# Patient Record
Sex: Female | Born: 1965 | Race: White | Hispanic: No | Marital: Married
Health system: Southern US, Community
[De-identification: ages and names within clinical notes are randomized; demographics above are authoritative.]

## PROBLEM LIST (undated history)

## (undated) HISTORY — PX: AUGMENTATION MAMMAPLASTY: SUR837

---

## 2014-08-09 ENCOUNTER — Other Ambulatory Visit (HOSPITAL_COMMUNITY): Payer: Self-pay | Admitting: Obstetrics and Gynecology

## 2014-08-09 DIAGNOSIS — Z1231 Encounter for screening mammogram for malignant neoplasm of breast: Secondary | ICD-10-CM

## 2014-08-26 ENCOUNTER — Ambulatory Visit (HOSPITAL_COMMUNITY)
Admission: RE | Admit: 2014-08-26 | Discharge: 2014-08-26 | Disposition: A | Discharge status: Home / Self Care | Payer: 59 | Source: Ambulatory Visit | Attending: Obstetrics and Gynecology | Admitting: Obstetrics and Gynecology

## 2014-08-26 ENCOUNTER — Other Ambulatory Visit (HOSPITAL_COMMUNITY): Payer: Self-pay | Admitting: Obstetrics and Gynecology

## 2014-08-26 DIAGNOSIS — Z1231 Encounter for screening mammogram for malignant neoplasm of breast: Secondary | ICD-10-CM

## 2015-08-02 ENCOUNTER — Other Ambulatory Visit: Payer: Self-pay

## 2015-08-02 DIAGNOSIS — Z1231 Encounter for screening mammogram for malignant neoplasm of breast: Secondary | ICD-10-CM

## 2015-08-28 ENCOUNTER — Ambulatory Visit: Payer: Self-pay

## 2015-09-20 ENCOUNTER — Ambulatory Visit
Admission: RE | Admit: 2015-09-20 | Discharge: 2015-09-20 | Disposition: A | Discharge status: Home / Self Care | Payer: 59 | Source: Ambulatory Visit

## 2015-09-20 DIAGNOSIS — Z1231 Encounter for screening mammogram for malignant neoplasm of breast: Secondary | ICD-10-CM

## 2016-08-19 ENCOUNTER — Other Ambulatory Visit: Payer: Self-pay | Admitting: Obstetrics and Gynecology

## 2016-08-19 DIAGNOSIS — Z1231 Encounter for screening mammogram for malignant neoplasm of breast: Secondary | ICD-10-CM

## 2016-09-25 ENCOUNTER — Ambulatory Visit
Admission: RE | Admit: 2016-09-25 | Discharge: 2016-09-25 | Disposition: A | Discharge status: Home / Self Care | Payer: 59 | Source: Ambulatory Visit | Attending: Obstetrics and Gynecology | Admitting: Obstetrics and Gynecology

## 2016-09-25 DIAGNOSIS — Z1231 Encounter for screening mammogram for malignant neoplasm of breast: Secondary | ICD-10-CM

## 2017-08-15 ENCOUNTER — Other Ambulatory Visit: Payer: Self-pay | Admitting: Obstetrics and Gynecology

## 2017-08-15 DIAGNOSIS — Z1231 Encounter for screening mammogram for malignant neoplasm of breast: Secondary | ICD-10-CM

## 2017-09-29 ENCOUNTER — Ambulatory Visit: Payer: 59

## 2017-10-23 ENCOUNTER — Ambulatory Visit
Admission: RE | Admit: 2017-10-23 | Discharge: 2017-10-23 | Disposition: A | Discharge status: Home / Self Care | Payer: 59 | Source: Ambulatory Visit | Attending: Obstetrics and Gynecology | Admitting: Obstetrics and Gynecology

## 2017-10-23 DIAGNOSIS — Z1231 Encounter for screening mammogram for malignant neoplasm of breast: Secondary | ICD-10-CM

## 2018-09-14 ENCOUNTER — Other Ambulatory Visit: Payer: Self-pay | Admitting: Obstetrics and Gynecology

## 2018-09-14 DIAGNOSIS — Z1231 Encounter for screening mammogram for malignant neoplasm of breast: Secondary | ICD-10-CM

## 2018-11-11 ENCOUNTER — Ambulatory Visit
Admission: RE | Admit: 2018-11-11 | Discharge: 2018-11-11 | Disposition: A | Discharge status: Home / Self Care | Payer: 59 | Source: Ambulatory Visit | Attending: Obstetrics and Gynecology | Admitting: Obstetrics and Gynecology

## 2018-11-11 DIAGNOSIS — Z1231 Encounter for screening mammogram for malignant neoplasm of breast: Secondary | ICD-10-CM

## 2019-10-05 ENCOUNTER — Other Ambulatory Visit: Payer: Self-pay | Admitting: Obstetrics and Gynecology

## 2019-10-05 DIAGNOSIS — Z1231 Encounter for screening mammogram for malignant neoplasm of breast: Secondary | ICD-10-CM

## 2019-11-26 ENCOUNTER — Other Ambulatory Visit: Payer: Self-pay

## 2019-11-26 ENCOUNTER — Ambulatory Visit
Admission: RE | Admit: 2019-11-26 | Discharge: 2019-11-26 | Disposition: A | Discharge status: Home / Self Care | Payer: 59 | Source: Ambulatory Visit | Attending: Obstetrics and Gynecology | Admitting: Obstetrics and Gynecology

## 2019-11-26 DIAGNOSIS — Z1231 Encounter for screening mammogram for malignant neoplasm of breast: Secondary | ICD-10-CM

## 2020-02-03 ENCOUNTER — Ambulatory Visit: Payer: 59 | Attending: Internal Medicine

## 2020-02-03 DIAGNOSIS — Z23 Encounter for immunization: Secondary | ICD-10-CM

## 2020-02-03 NOTE — Progress Notes (Signed)
   Covid-19 Vaccination Clinic  Name:  Dominique Ryan    MRN: [id number] DOB: 09-Jul-1966  02/03/2020  [name] was observed post Covid-19 immunization for 15 minutes without incident. She was provided with Vaccine Information Sheet and instruction to access the V-Safe system.   [name] was instructed to call 911 with any severe reactions post vaccine: Marland Kitchen Difficulty breathing  . Swelling of face and throat  . A fast heartbeat  . A bad rash all over body  . Dizziness and weakness   Immunizations Administered    Name Date Dose VIS Date Route   Pfizer COVID-19 Vaccine 02/03/2020  3:38 PM 0.3 mL 10/15/2019 Intramuscular   Manufacturer: [employer], [employer]   Lot: [id number]   NDC: [address]-3

## 2020-02-28 ENCOUNTER — Ambulatory Visit: Payer: 59 | Attending: Internal Medicine

## 2020-02-28 DIAGNOSIS — Z23 Encounter for immunization: Secondary | ICD-10-CM

## 2020-02-28 NOTE — Progress Notes (Signed)
   Covid-19 Vaccination Clinic  Name:  Dominique Ryan    MRN: [id number] DOB: August 01, 1966  02/28/2020  [name] was observed post Covid-19 immunization for 15 minutes without incident. She was provided with Vaccine Information Sheet and instruction to access the V-Safe system.   [name] was instructed to call 911 with any severe reactions post vaccine: Marland Kitchen Difficulty breathing  . Swelling of face and throat  . A fast heartbeat  . A bad rash all over body  . Dizziness and weakness   Immunizations Administered    Name Date Dose VIS Date Route   Pfizer COVID-19 Vaccine 02/28/2020 10:37 AM 0.3 mL 12/29/2018 Intramuscular   Manufacturer: [employer], [employer]   Lot: [id number]   NDC: [address]-5

## 2020-10-13 ENCOUNTER — Other Ambulatory Visit: Payer: Self-pay | Admitting: Obstetrics and Gynecology

## 2020-10-13 DIAGNOSIS — Z1231 Encounter for screening mammogram for malignant neoplasm of breast: Secondary | ICD-10-CM

## 2020-11-27 ENCOUNTER — Ambulatory Visit
Admission: RE | Admit: 2020-11-27 | Discharge: 2020-11-27 | Disposition: A | Discharge status: Home / Self Care | Payer: 59 | Source: Ambulatory Visit | Attending: Obstetrics and Gynecology | Admitting: Obstetrics and Gynecology

## 2020-11-27 ENCOUNTER — Other Ambulatory Visit: Payer: Self-pay | Admitting: Obstetrics and Gynecology

## 2020-11-27 ENCOUNTER — Other Ambulatory Visit: Payer: Self-pay

## 2020-11-27 DIAGNOSIS — Z1231 Encounter for screening mammogram for malignant neoplasm of breast: Secondary | ICD-10-CM

## 2020-11-28 ENCOUNTER — Other Ambulatory Visit: Payer: Self-pay | Admitting: Obstetrics and Gynecology

## 2020-11-28 DIAGNOSIS — R928 Other abnormal and inconclusive findings on diagnostic imaging of breast: Secondary | ICD-10-CM

## 2020-11-30 ENCOUNTER — Ambulatory Visit
Admission: RE | Admit: 2020-11-30 | Discharge: 2020-11-30 | Disposition: A | Discharge status: Home / Self Care | Payer: 59 | Source: Ambulatory Visit | Attending: Obstetrics and Gynecology | Admitting: Obstetrics and Gynecology

## 2020-11-30 ENCOUNTER — Other Ambulatory Visit: Payer: Self-pay

## 2020-11-30 ENCOUNTER — Ambulatory Visit: Payer: 59

## 2020-11-30 DIAGNOSIS — R928 Other abnormal and inconclusive findings on diagnostic imaging of breast: Secondary | ICD-10-CM

## 2020-12-08 ENCOUNTER — Other Ambulatory Visit: Payer: 59

## 2021-11-28 ENCOUNTER — Other Ambulatory Visit: Payer: Self-pay | Admitting: Obstetrics and Gynecology

## 2021-11-28 DIAGNOSIS — Z1231 Encounter for screening mammogram for malignant neoplasm of breast: Secondary | ICD-10-CM

## 2021-12-20 ENCOUNTER — Ambulatory Visit
Admission: RE | Admit: 2021-12-20 | Discharge: 2021-12-20 | Disposition: A | Discharge status: Home / Self Care | Payer: BC Managed Care – PPO | Source: Ambulatory Visit | Attending: Obstetrics and Gynecology | Admitting: Obstetrics and Gynecology

## 2021-12-20 DIAGNOSIS — Z1231 Encounter for screening mammogram for malignant neoplasm of breast: Secondary | ICD-10-CM

## 2022-11-19 ENCOUNTER — Other Ambulatory Visit: Payer: Self-pay | Admitting: Obstetrics and Gynecology

## 2022-11-19 DIAGNOSIS — Z1231 Encounter for screening mammogram for malignant neoplasm of breast: Secondary | ICD-10-CM

## 2023-01-08 ENCOUNTER — Ambulatory Visit
Admission: RE | Admit: 2023-01-08 | Discharge: 2023-01-08 | Disposition: A | Discharge status: Home / Self Care | Payer: BC Managed Care – PPO | Source: Ambulatory Visit | Attending: Obstetrics and Gynecology | Admitting: Obstetrics and Gynecology

## 2023-01-08 DIAGNOSIS — Z1231 Encounter for screening mammogram for malignant neoplasm of breast: Secondary | ICD-10-CM

## 2023-08-15 IMAGING — MG DIGITAL SCREENING BREAST BILAT IMPLANT W/ TOMO W/ CAD
9 of 12 series · 9 of 28 positions shown · non-contrast
Comparison: Previous exam(s).

CLINICAL DATA: Screening.

EXAM:
DIGITAL SCREENING BILATERAL MAMMOGRAM WITH IMPLANTS, CAD AND
TOMOSYNTHESIS
TECHNIQUE: Bilateral screening digital craniocaudal and mediolateral oblique
mammograms were obtained. Bilateral screening digital breast
tomosynthesis was performed. The images were evaluated with
computer-aided detection. Standard and/or implant displaced views
were performed.

[R MLO]
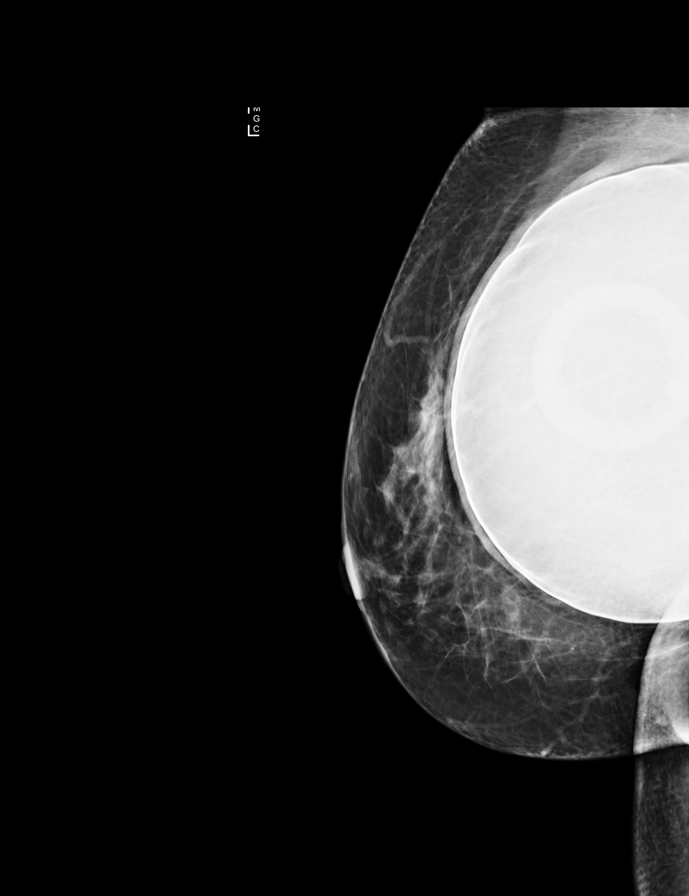

[L CC]
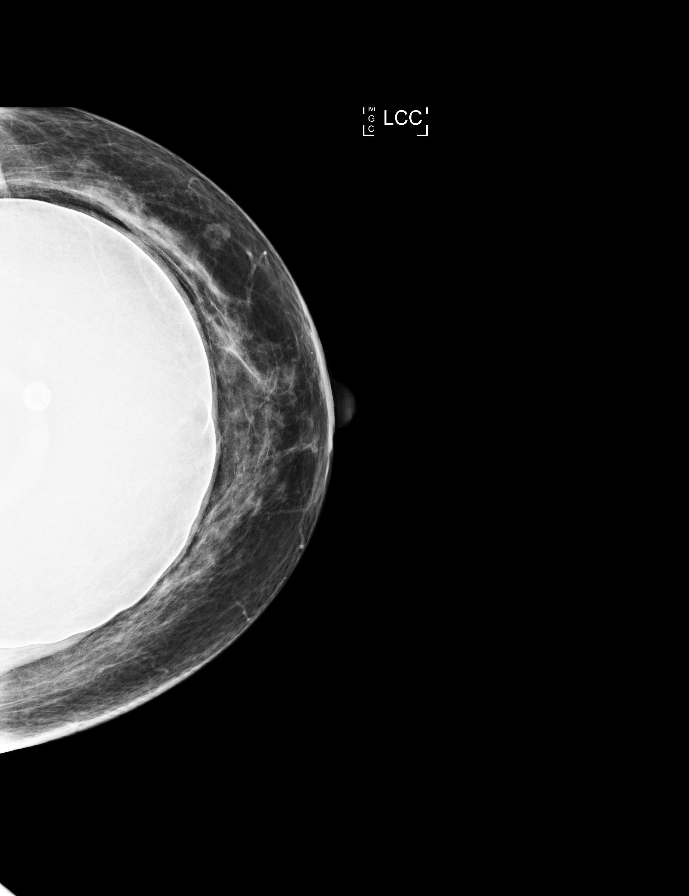

[L MLO]
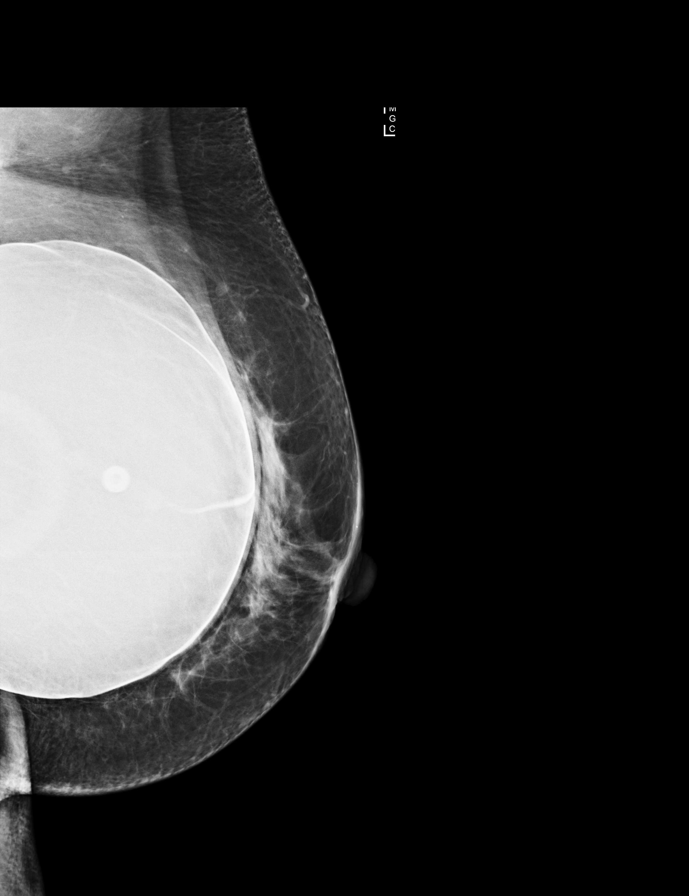

[R CC]
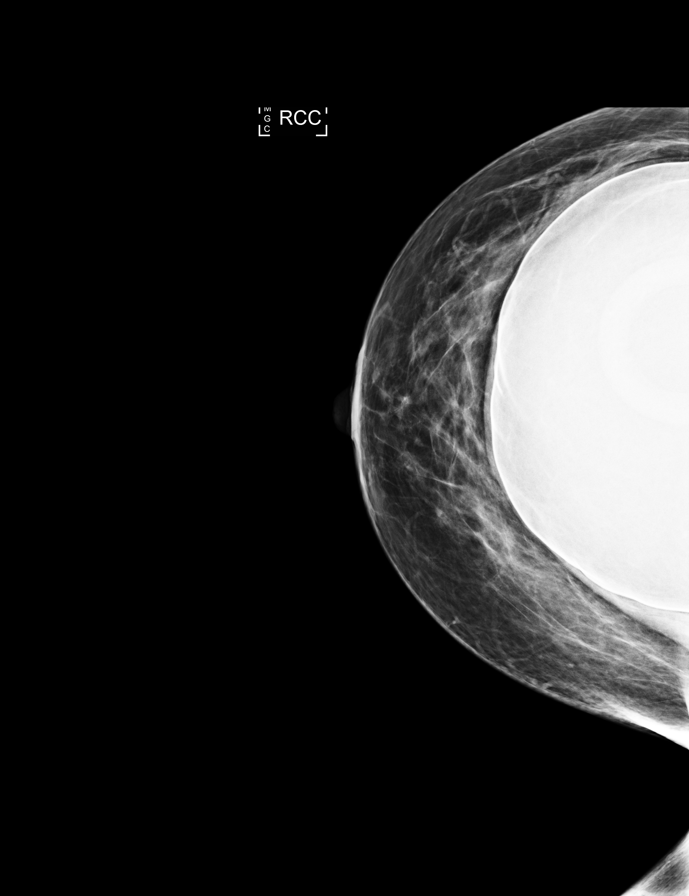

[R CC synth-2D]
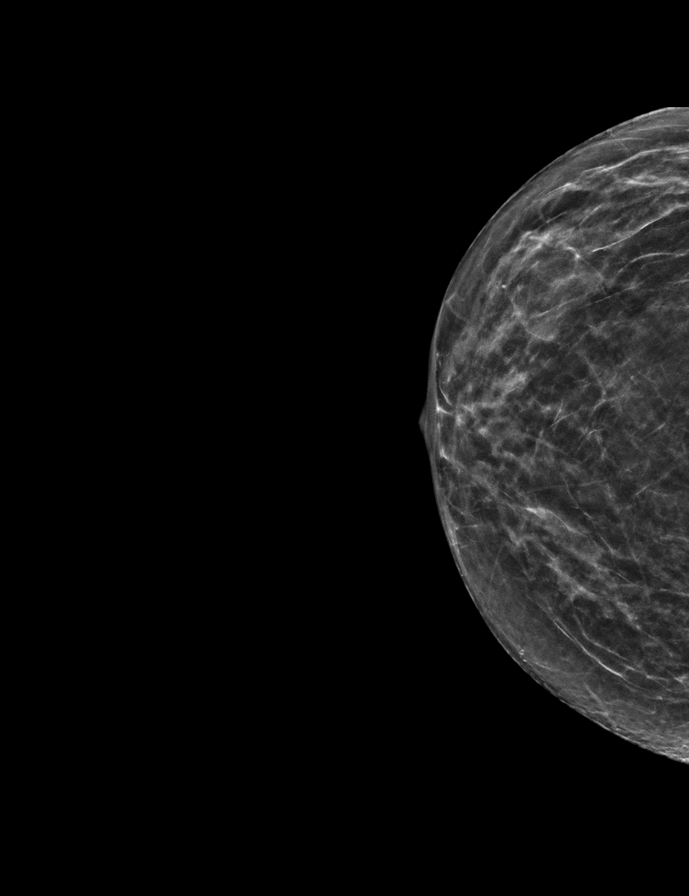

[L MLO synth-2D]
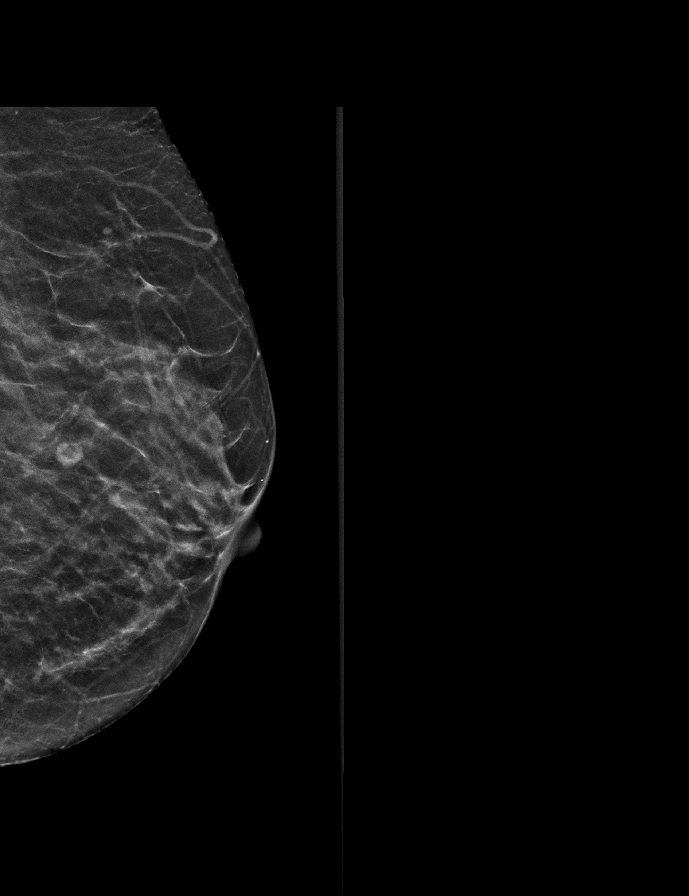

[L CC synth-2D]
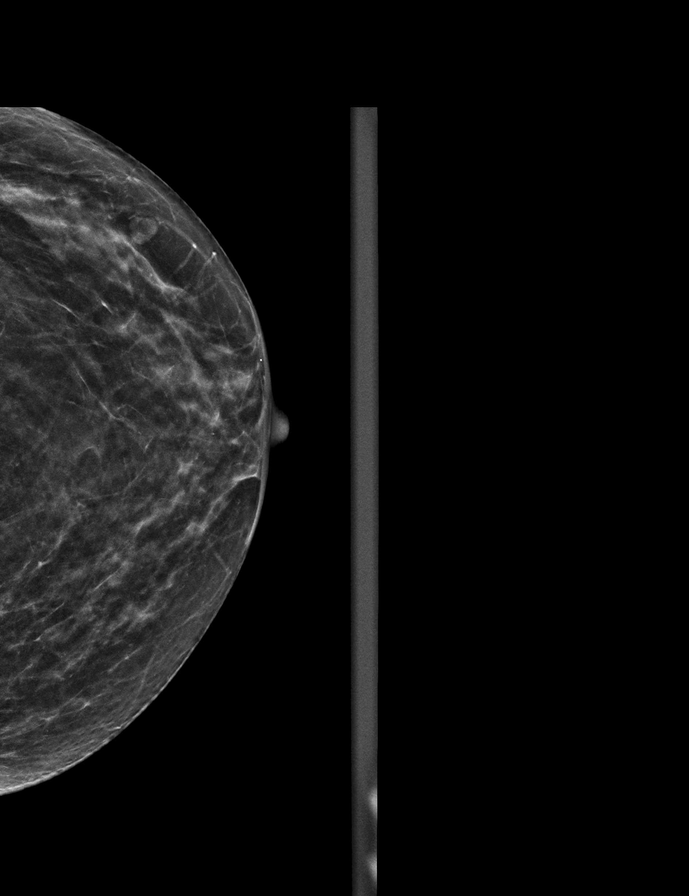

[R MLO synth-2D]
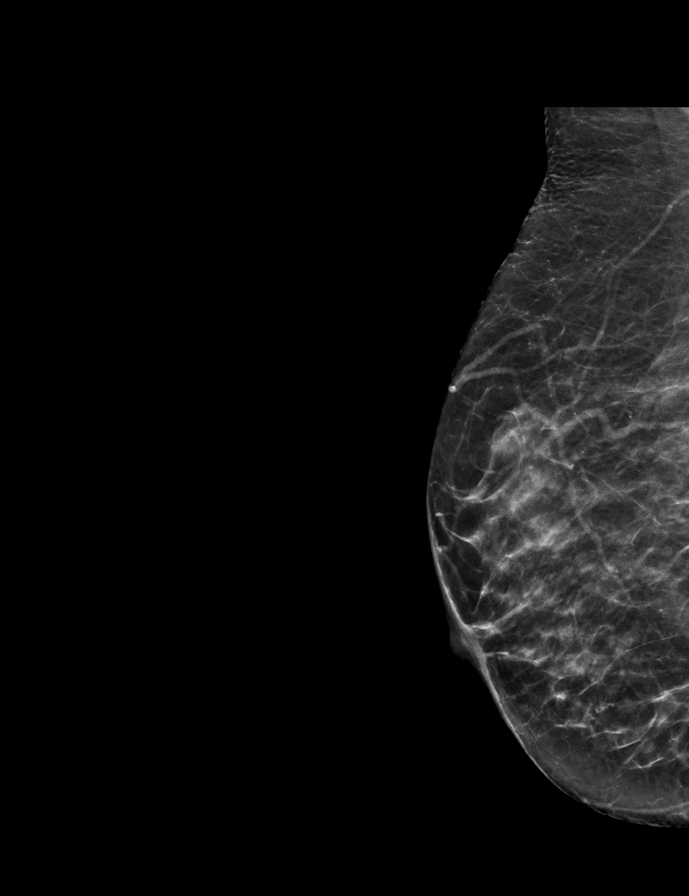

[L CCID BREAST TOMOSYNTHESIS IMAGE tomo · tomo slice 23/46.0]
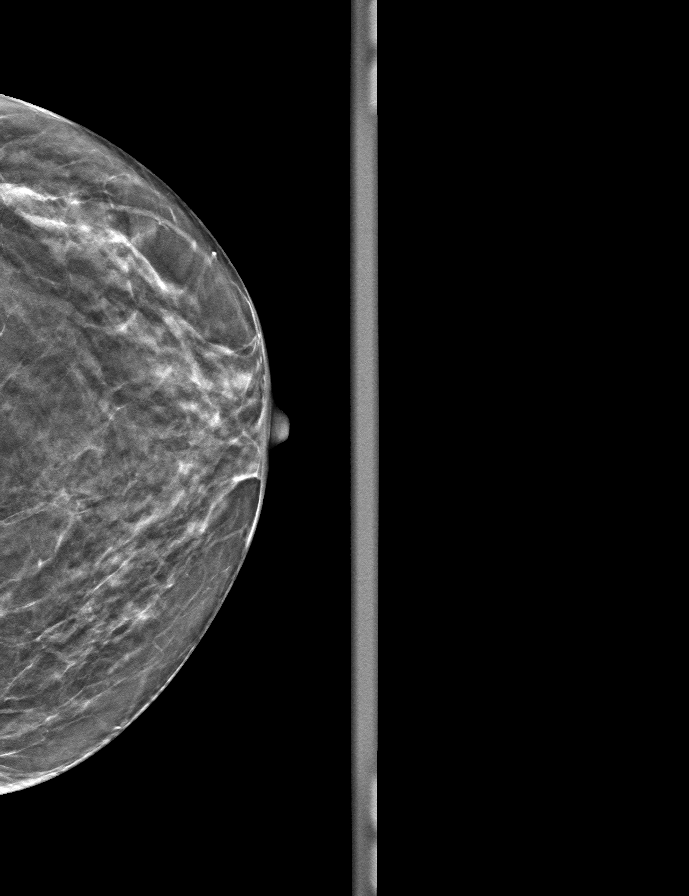

[9 of 28 positions shown; findings below may reference images not displayed]

ACR Breast Density Category b: There are scattered areas of
fibroglandular density.
FINDINGS: The patient has implants. There are no findings suspicious for
malignancy.
IMPRESSION: No mammographic evidence of malignancy. A result letter of this
screening mammogram will be mailed directly to the patient.

RECOMMENDATION:
Screening mammogram in one year. (Code:[id number])

BI-RADS CATEGORY  1:  Negative.

## 2023-12-22 ENCOUNTER — Other Ambulatory Visit: Payer: Self-pay | Admitting: Obstetrics and Gynecology

## 2023-12-22 DIAGNOSIS — Z1231 Encounter for screening mammogram for malignant neoplasm of breast: Secondary | ICD-10-CM

## 2024-01-20 DIAGNOSIS — Z1231 Encounter for screening mammogram for malignant neoplasm of breast: Secondary | ICD-10-CM
